# Patient Record
Sex: Female | Born: 1955
Health system: Southern US, Community
[De-identification: ages and names within clinical notes are randomized; demographics above are authoritative.]

## PROBLEM LIST (undated history)

## (undated) DIAGNOSIS — I10 Essential (primary) hypertension: Secondary | ICD-10-CM

## (undated) DIAGNOSIS — E119 Type 2 diabetes mellitus without complications: Secondary | ICD-10-CM

## (undated) DIAGNOSIS — E785 Hyperlipidemia, unspecified: Secondary | ICD-10-CM

## (undated) HISTORY — PX: ABDOMINAL HYSTERECTOMY: SHX81

---

## 2004-12-05 ENCOUNTER — Ambulatory Visit: Payer: Self-pay

## 2005-09-18 ENCOUNTER — Ambulatory Visit: Payer: Self-pay | Admitting: *Deleted

## 2006-03-27 ENCOUNTER — Ambulatory Visit: Payer: Self-pay | Admitting: Obstetrics and Gynecology

## 2008-02-03 ENCOUNTER — Ambulatory Visit: Payer: Self-pay

## 2008-04-14 ENCOUNTER — Ambulatory Visit: Payer: Self-pay | Admitting: Gastroenterology

## 2010-05-02 ENCOUNTER — Ambulatory Visit: Payer: Self-pay | Admitting: Family Medicine

## 2011-08-29 ENCOUNTER — Ambulatory Visit: Payer: Self-pay | Admitting: Obstetrics and Gynecology

## 2013-05-14 ENCOUNTER — Ambulatory Visit: Payer: Self-pay | Admitting: Family Medicine

## 2014-03-11 DIAGNOSIS — I10 Essential (primary) hypertension: Secondary | ICD-10-CM | POA: Insufficient documentation

## 2014-03-11 DIAGNOSIS — E119 Type 2 diabetes mellitus without complications: Secondary | ICD-10-CM | POA: Insufficient documentation

## 2014-03-11 DIAGNOSIS — E785 Hyperlipidemia, unspecified: Secondary | ICD-10-CM | POA: Insufficient documentation

## 2014-05-18 ENCOUNTER — Ambulatory Visit: Payer: Self-pay

## 2015-02-11 ENCOUNTER — Other Ambulatory Visit: Payer: Self-pay | Admitting: Family Medicine

## 2015-02-11 DIAGNOSIS — Z1231 Encounter for screening mammogram for malignant neoplasm of breast: Secondary | ICD-10-CM

## 2016-04-26 ENCOUNTER — Telehealth: Payer: Self-pay | Admitting: Gastroenterology

## 2016-04-26 ENCOUNTER — Other Ambulatory Visit: Payer: Self-pay | Admitting: Nurse Practitioner

## 2016-04-26 DIAGNOSIS — Z1239 Encounter for other screening for malignant neoplasm of breast: Secondary | ICD-10-CM

## 2016-04-26 NOTE — Telephone Encounter (Signed)
colonoscopy

## 2016-05-08 NOTE — Telephone Encounter (Signed)
Called patient, left voicemail. Appointment not scheduled.

## 2016-05-11 NOTE — Telephone Encounter (Signed)
Sent patient a notification letter to their address on file  

## 2016-05-14 NOTE — Telephone Encounter (Signed)
Called pt and lvm. Notification letter sent on 05/11/2016

## 2016-07-17 ENCOUNTER — Ambulatory Visit
Admission: RE | Admit: 2016-07-17 | Discharge: 2016-07-17 | Disposition: A | Discharge status: Home / Self Care | Payer: 59 | Source: Ambulatory Visit | Attending: Nurse Practitioner | Admitting: Nurse Practitioner

## 2016-07-17 ENCOUNTER — Encounter: Payer: Self-pay | Admitting: Radiology

## 2016-07-17 DIAGNOSIS — Z1239 Encounter for other screening for malignant neoplasm of breast: Secondary | ICD-10-CM

## 2016-07-17 DIAGNOSIS — Z1231 Encounter for screening mammogram for malignant neoplasm of breast: Secondary | ICD-10-CM | POA: Diagnosis not present

## 2016-12-19 DIAGNOSIS — Z1159 Encounter for screening for other viral diseases: Secondary | ICD-10-CM | POA: Diagnosis not present

## 2016-12-19 DIAGNOSIS — E1121 Type 2 diabetes mellitus with diabetic nephropathy: Secondary | ICD-10-CM | POA: Diagnosis not present

## 2016-12-19 DIAGNOSIS — Z1389 Encounter for screening for other disorder: Secondary | ICD-10-CM | POA: Diagnosis not present

## 2016-12-19 DIAGNOSIS — Z1329 Encounter for screening for other suspected endocrine disorder: Secondary | ICD-10-CM | POA: Diagnosis not present

## 2016-12-19 DIAGNOSIS — E785 Hyperlipidemia, unspecified: Secondary | ICD-10-CM | POA: Diagnosis not present

## 2016-12-19 DIAGNOSIS — I10 Essential (primary) hypertension: Secondary | ICD-10-CM | POA: Diagnosis not present

## 2017-01-01 DIAGNOSIS — M25552 Pain in left hip: Secondary | ICD-10-CM | POA: Diagnosis not present

## 2017-01-01 DIAGNOSIS — M25551 Pain in right hip: Secondary | ICD-10-CM | POA: Diagnosis not present

## 2017-01-01 DIAGNOSIS — M545 Low back pain: Secondary | ICD-10-CM | POA: Diagnosis not present

## 2017-01-16 DIAGNOSIS — M545 Low back pain: Secondary | ICD-10-CM | POA: Diagnosis not present

## 2017-01-16 DIAGNOSIS — M5440 Lumbago with sciatica, unspecified side: Secondary | ICD-10-CM | POA: Diagnosis not present

## 2017-01-16 DIAGNOSIS — G8929 Other chronic pain: Secondary | ICD-10-CM | POA: Diagnosis not present

## 2017-01-16 DIAGNOSIS — M48061 Spinal stenosis, lumbar region without neurogenic claudication: Secondary | ICD-10-CM | POA: Diagnosis not present

## 2017-02-06 DIAGNOSIS — M5136 Other intervertebral disc degeneration, lumbar region: Secondary | ICD-10-CM | POA: Diagnosis not present

## 2017-02-06 DIAGNOSIS — M5440 Lumbago with sciatica, unspecified side: Secondary | ICD-10-CM | POA: Diagnosis not present

## 2017-02-06 DIAGNOSIS — M545 Low back pain: Secondary | ICD-10-CM | POA: Diagnosis not present

## 2017-02-13 DIAGNOSIS — M5417 Radiculopathy, lumbosacral region: Secondary | ICD-10-CM | POA: Diagnosis not present

## 2017-02-13 DIAGNOSIS — M545 Low back pain: Secondary | ICD-10-CM | POA: Diagnosis not present

## 2017-02-13 DIAGNOSIS — M48061 Spinal stenosis, lumbar region without neurogenic claudication: Secondary | ICD-10-CM | POA: Diagnosis not present

## 2017-02-22 DIAGNOSIS — M545 Low back pain: Secondary | ICD-10-CM | POA: Diagnosis not present

## 2017-02-22 DIAGNOSIS — M5417 Radiculopathy, lumbosacral region: Secondary | ICD-10-CM | POA: Diagnosis not present

## 2017-02-22 DIAGNOSIS — M48061 Spinal stenosis, lumbar region without neurogenic claudication: Secondary | ICD-10-CM | POA: Diagnosis not present

## 2017-02-25 DIAGNOSIS — M5417 Radiculopathy, lumbosacral region: Secondary | ICD-10-CM | POA: Diagnosis not present

## 2017-02-25 DIAGNOSIS — M48061 Spinal stenosis, lumbar region without neurogenic claudication: Secondary | ICD-10-CM | POA: Diagnosis not present

## 2017-02-25 DIAGNOSIS — M545 Low back pain: Secondary | ICD-10-CM | POA: Diagnosis not present

## 2017-02-28 DIAGNOSIS — M48061 Spinal stenosis, lumbar region without neurogenic claudication: Secondary | ICD-10-CM | POA: Diagnosis not present

## 2017-02-28 DIAGNOSIS — M545 Low back pain: Secondary | ICD-10-CM | POA: Diagnosis not present

## 2017-02-28 DIAGNOSIS — M5417 Radiculopathy, lumbosacral region: Secondary | ICD-10-CM | POA: Diagnosis not present

## 2017-03-05 DIAGNOSIS — M545 Low back pain: Secondary | ICD-10-CM | POA: Diagnosis not present

## 2017-03-05 DIAGNOSIS — M48061 Spinal stenosis, lumbar region without neurogenic claudication: Secondary | ICD-10-CM | POA: Diagnosis not present

## 2017-03-05 DIAGNOSIS — M5417 Radiculopathy, lumbosacral region: Secondary | ICD-10-CM | POA: Diagnosis not present

## 2017-03-06 DIAGNOSIS — E118 Type 2 diabetes mellitus with unspecified complications: Secondary | ICD-10-CM | POA: Diagnosis not present

## 2017-03-06 DIAGNOSIS — M545 Low back pain: Secondary | ICD-10-CM | POA: Diagnosis not present

## 2017-03-08 DIAGNOSIS — M545 Low back pain: Secondary | ICD-10-CM | POA: Diagnosis not present

## 2017-03-08 DIAGNOSIS — M48061 Spinal stenosis, lumbar region without neurogenic claudication: Secondary | ICD-10-CM | POA: Diagnosis not present

## 2017-03-08 DIAGNOSIS — M5417 Radiculopathy, lumbosacral region: Secondary | ICD-10-CM | POA: Diagnosis not present

## 2017-03-12 DIAGNOSIS — M48061 Spinal stenosis, lumbar region without neurogenic claudication: Secondary | ICD-10-CM | POA: Diagnosis not present

## 2017-03-12 DIAGNOSIS — M791 Myalgia: Secondary | ICD-10-CM | POA: Diagnosis not present

## 2017-03-12 DIAGNOSIS — M545 Low back pain: Secondary | ICD-10-CM | POA: Diagnosis not present

## 2017-03-12 DIAGNOSIS — M5417 Radiculopathy, lumbosacral region: Secondary | ICD-10-CM | POA: Diagnosis not present

## 2017-03-18 DIAGNOSIS — M5417 Radiculopathy, lumbosacral region: Secondary | ICD-10-CM | POA: Diagnosis not present

## 2017-03-18 DIAGNOSIS — M545 Low back pain: Secondary | ICD-10-CM | POA: Diagnosis not present

## 2017-03-18 DIAGNOSIS — M48061 Spinal stenosis, lumbar region without neurogenic claudication: Secondary | ICD-10-CM | POA: Diagnosis not present

## 2017-03-25 DIAGNOSIS — M5417 Radiculopathy, lumbosacral region: Secondary | ICD-10-CM | POA: Diagnosis not present

## 2017-03-25 DIAGNOSIS — M48061 Spinal stenosis, lumbar region without neurogenic claudication: Secondary | ICD-10-CM | POA: Diagnosis not present

## 2017-03-25 DIAGNOSIS — M545 Low back pain: Secondary | ICD-10-CM | POA: Diagnosis not present

## 2017-04-12 DIAGNOSIS — M791 Myalgia: Secondary | ICD-10-CM | POA: Diagnosis not present

## 2017-05-12 DIAGNOSIS — M791 Myalgia: Secondary | ICD-10-CM | POA: Diagnosis not present

## 2017-06-12 DIAGNOSIS — M791 Myalgia: Secondary | ICD-10-CM | POA: Diagnosis not present

## 2017-07-13 DIAGNOSIS — M791 Myalgia: Secondary | ICD-10-CM | POA: Diagnosis not present

## 2017-08-12 DIAGNOSIS — M791 Myalgia, unspecified site: Secondary | ICD-10-CM | POA: Diagnosis not present

## 2017-08-29 ENCOUNTER — Other Ambulatory Visit: Payer: Self-pay | Admitting: Family Medicine

## 2017-08-29 DIAGNOSIS — Z1231 Encounter for screening mammogram for malignant neoplasm of breast: Secondary | ICD-10-CM

## 2017-09-06 DIAGNOSIS — E785 Hyperlipidemia, unspecified: Secondary | ICD-10-CM | POA: Diagnosis not present

## 2017-09-06 DIAGNOSIS — E1121 Type 2 diabetes mellitus with diabetic nephropathy: Secondary | ICD-10-CM | POA: Diagnosis not present

## 2017-09-06 DIAGNOSIS — I10 Essential (primary) hypertension: Secondary | ICD-10-CM | POA: Diagnosis not present

## 2017-09-12 DIAGNOSIS — M791 Myalgia, unspecified site: Secondary | ICD-10-CM | POA: Diagnosis not present

## 2017-09-24 ENCOUNTER — Ambulatory Visit
Admission: RE | Admit: 2017-09-24 | Discharge: 2017-09-24 | Disposition: A | Discharge status: Home / Self Care | Payer: 59 | Source: Ambulatory Visit | Attending: Family Medicine | Admitting: Family Medicine

## 2017-09-24 DIAGNOSIS — Z1231 Encounter for screening mammogram for malignant neoplasm of breast: Secondary | ICD-10-CM | POA: Diagnosis present

## 2017-10-12 DIAGNOSIS — M791 Myalgia, unspecified site: Secondary | ICD-10-CM | POA: Diagnosis not present

## 2017-11-12 DIAGNOSIS — M791 Myalgia, unspecified site: Secondary | ICD-10-CM | POA: Diagnosis not present

## 2017-12-13 DIAGNOSIS — M791 Myalgia, unspecified site: Secondary | ICD-10-CM | POA: Diagnosis not present

## 2017-12-29 ENCOUNTER — Encounter: Payer: Self-pay | Admitting: Emergency Medicine

## 2017-12-29 ENCOUNTER — Emergency Department
Admission: EM | Admit: 2017-12-29 | Discharge: 2017-12-29 | Disposition: A | Discharge status: Home / Self Care | Payer: Commercial Managed Care - HMO | Attending: Emergency Medicine | Admitting: Emergency Medicine

## 2017-12-29 DIAGNOSIS — I10 Essential (primary) hypertension: Secondary | ICD-10-CM | POA: Insufficient documentation

## 2017-12-29 DIAGNOSIS — S0083XA Contusion of other part of head, initial encounter: Secondary | ICD-10-CM | POA: Insufficient documentation

## 2017-12-29 DIAGNOSIS — Z23 Encounter for immunization: Secondary | ICD-10-CM | POA: Diagnosis not present

## 2017-12-29 DIAGNOSIS — Y92511 Restaurant or cafe as the place of occurrence of the external cause: Secondary | ICD-10-CM | POA: Diagnosis not present

## 2017-12-29 DIAGNOSIS — S0081XA Abrasion of other part of head, initial encounter: Secondary | ICD-10-CM | POA: Insufficient documentation

## 2017-12-29 DIAGNOSIS — W19XXXA Unspecified fall, initial encounter: Secondary | ICD-10-CM

## 2017-12-29 DIAGNOSIS — Y998 Other external cause status: Secondary | ICD-10-CM | POA: Insufficient documentation

## 2017-12-29 DIAGNOSIS — W01198A Fall on same level from slipping, tripping and stumbling with subsequent striking against other object, initial encounter: Secondary | ICD-10-CM | POA: Diagnosis not present

## 2017-12-29 DIAGNOSIS — Y9389 Activity, other specified: Secondary | ICD-10-CM | POA: Insufficient documentation

## 2017-12-29 DIAGNOSIS — E119 Type 2 diabetes mellitus without complications: Secondary | ICD-10-CM | POA: Diagnosis not present

## 2017-12-29 DIAGNOSIS — S0993XA Unspecified injury of face, initial encounter: Secondary | ICD-10-CM | POA: Diagnosis present

## 2017-12-29 DIAGNOSIS — S161XXA Strain of muscle, fascia and tendon at neck level, initial encounter: Secondary | ICD-10-CM | POA: Insufficient documentation

## 2017-12-29 HISTORY — DX: Type 2 diabetes mellitus without complications: E11.9

## 2017-12-29 HISTORY — DX: Essential (primary) hypertension: I10

## 2017-12-29 HISTORY — DX: Hyperlipidemia, unspecified: E78.5

## 2017-12-29 MED ORDER — SULFAMETHOXAZOLE-TRIMETHOPRIM 800-160 MG PO TABS: 1.0000 | ORAL_TABLET | Freq: Two times a day (BID) | ORAL | 0 refills | Status: DC

## 2017-12-29 MED ORDER — METHOCARBAMOL 500 MG PO TABS: 500.0000 mg | ORAL_TABLET | Freq: Four times a day (QID) | ORAL | 0 refills | Status: AC

## 2017-12-29 MED ORDER — MELOXICAM 15 MG PO TABS: 15.0000 mg | ORAL_TABLET | Freq: Every day | ORAL | 0 refills | Status: AC

## 2017-12-29 MED ORDER — TETANUS-DIPHTH-ACELL PERTUSSIS 5-2.5-18.5 LF-MCG/0.5 IM SUSP
0.5000 mL | Freq: Once | INTRAMUSCULAR | Status: AC
  Administered 2017-12-29: 0.5 mL via INTRAMUSCULAR
  Filled 2017-12-29: qty 0.5

## 2017-12-29 NOTE — ED Notes (Signed)
Pt states fell last night after tripping over a curb. Pt presents with abrasions to face. C/o neck pain. Pt denies LOC at this time. Pt is alert and oriented. NAD noted.

## 2017-12-29 NOTE — ED Provider Notes (Signed)
[facility] Emergency Department Provider Note  ____________________________________________  Time seen: Approximately 2:59 PM  I have reviewed the triage vital signs and the nursing notes.   HISTORY  Chief Complaint Fall    HPI Maria Estrada is a 62 y.o. female who presents the emergency department status post mechanical fall.  Patient reports that she tripped over a curb, fell striking her face on the ground.  Patient reports that she did not lose consciousness prior to or status post a fall.  Patient reports that she has sustained abrasions to the face, nose as well as a "puffy lip."  Patient denies any headache, visual changes, facial pain at this time.  Patient reports mild right-sided neck pain/stiffness.  She has full range of motion to the neck and states that on extreme rotation to the right side she has tightness along the paraspinal muscle region.  Patient states that she had tried to catch herself with her hands but denies any kind of abrasion, lacerations, musculoskeletal complaints to bilateral hands or wrists.  Patient is not taking medications prior to arrival.  She is unsure of her last tetanus shot.  No other complaints at this time.  Past Medical History:  Diagnosis Date  . Diabetes mellitus without complication (HCC)   . Hyperlipidemia   . Hypertension     There are no active problems to display for this patient.   Past Surgical History:  Procedure Laterality Date  . ABDOMINAL HYSTERECTOMY      Prior to Admission medications   Medication Sig Start Date End Date Taking? Authorizing Provider  meloxicam (MOBIC) 15 MG tablet Take 1 tablet (15 mg total) by mouth daily. 12/29/17   Rosanne Wohlfarth, Delorise RoyalsJonathan D, PA-C  methocarbamol (ROBAXIN) 500 MG tablet Take 1 tablet (500 mg total) by mouth 4 (four) times daily. 12/29/17   Gregorio Worley, Delorise RoyalsJonathan D, PA-C  sulfamethoxazole-trimethoprim (BACTRIM DS,SEPTRA DS) 800-160 MG tablet Take 1 tablet by mouth 2  (two) times daily. 12/29/17   Jahniya Duzan, Delorise RoyalsJonathan D, PA-C    Allergies Penicillins  Family History  Problem Relation Age of Onset  . Breast cancer Maternal Aunt     Social History Social History   Tobacco Use  . Smoking status: Never Smoker  . Smokeless tobacco: Never Used  Substance Use Topics  . Alcohol use: Yes  . Drug use: No     Review of Systems  Constitutional: No fever/chills Eyes: No visual changes. No discharge ENT: No upper respiratory complaints. Cardiovascular: no chest pain. Respiratory: no cough. No SOB. Gastrointestinal: No abdominal pain.  No nausea, no vomiting.  No diarrhea.  No constipation. Musculoskeletal: Positive for right-sided neck pain. Skin: Positive for abrasion to the forehead, nose. Neurological: Negative for headaches, focal weakness or numbness. 10-point ROS otherwise negative.  ____________________________________________   PHYSICAL EXAM:  VITAL SIGNS: ED Triage Vitals  Enc Vitals Group     BP 12/29/17 1402 (!) 144/69     Pulse Rate 12/29/17 1402 68     Resp 12/29/17 1402 18     Temp 12/29/17 1402 98.4 F (36.9 C)     Temp Source 12/29/17 1402 Oral     SpO2 12/29/17 1402 97 %     Weight 12/29/17 1403 283 lb (128.4 kg)     Height 12/29/17 1403 5\' 10"  (1.778 m)     Head Circumference --      Peak Flow --      Pain Score 12/29/17 1403 1     Pain Loc --  Pain Edu? --      Excl. in GC? --      Constitutional: Alert and oriented. Well appearing and in no acute distress. Eyes: Conjunctivae are normal. PERRL. EOMI. Head: Patient has superficial abrasion noted to the forehead, nose.  No foreign body.  No bleeding.  No lacerations noted.  Patient is nontender palpation of the osseous structures of the skull and face.  No battle signs.  No raccoon eyes.  No serosanguineous fluid drainage from the ears or nares.  ENT:      Ears:       Nose: No congestion/rhinnorhea.      Mouth/Throat: Mucous membranes are moist.  Neck: No  stridor.  No cervical spine tenderness to palpation.  Patient is nontender both midline and paraspinal muscle regions.  She reports tenderness with extreme rotation to the right.  No other reported pain complaints with range of motion.  Full range of motion to the cervical spine.  Radial pulse intact bilateral upper extremities.  Sensation intact and equal bilateral upper extremities.  Cardiovascular: Normal rate, regular rhythm. Normal S1 and S2.  Good peripheral circulation. Respiratory: Normal respiratory effort without tachypnea or retractions. Lungs CTAB. Good air entry to the bases with no decreased or absent breath sounds. Musculoskeletal: Full range of motion to all extremities. No gross deformities appreciated. Neurologic:  Normal speech and language. No gross focal neurologic deficits are appreciated.  Cranial nerves II through XII grossly intact.  Negative Romberg's and pronator drift. Skin:  Skin is warm, dry and intact. No rash noted. Psychiatric: Mood and affect are normal. Speech and behavior are normal. Patient exhibits appropriate insight and judgement.   ____________________________________________   LABS (all labs ordered are listed, but only abnormal results are displayed)  Labs Reviewed - No data to display ____________________________________________  EKG   ____________________________________________  RADIOLOGY   No results found.  ____________________________________________    PROCEDURES  Procedure(s) performed:    Procedures    Canadian CT Head Rule   CT head is recommended if yes to ANY of the following:   Major Criteria ("high risk" for an injury requiring neurosurgical intervention, sensitivity 100%):   No.   GCS < 15 at 2 hours post-injury No.   Suspected open or depressed skull fracture No.   Any sign of basilar skull fracture? (Hemotympanum, racoon eyes, battle's sign, CSF oto/rhinorrhea) No.   ? 2 episodes of vomiting No.   Age ? 65    Minor Criteria ("medium" risk for an intracranial traumatic finding, sensitivity 83-100%):   No.   Retrograde Amnesia to the Event ? 30 minutes No.   "Dangerous" Mechanism? (Pedestrian struck by motor vehicle, occupant ejected from motor vehicle, fall from >3 ft or >5 stairs.)   Based on my evaluation of the patient, including application of this decision instrument, CT head to evaluate for traumatic intracranial injury is not indicated at this time. I have discussed this recommendation with the patient who states understanding and agreement with this plan.  NEXUS C-spine Criteria   C-spine imaging is recommended if yes to ANY of the following ([place]neum[place] is "NSAID"):   No.  N - neurologic (focal) deficit present No.   S - spinal midline tenderness present No.  A - altered level of consciousness present No.    I  - intoxication present No.   D - distracting injury present   Based on my evaluation of the patient, including application of this decision instrument, cervical spine imaging to evaluate for  injury is not indicated at this time. I have discussed this recommendation with the patient who states understanding and agreement with this plan.   Medications - No data to display   ____________________________________________   INITIAL IMPRESSION / ASSESSMENT AND PLAN / ED COURSE  Pertinent labs & imaging results that were available during my care of the patient were reviewed by me and considered in my medical decision making (see chart for details).  Review of the [place] CSRS was performed in accordance of the [facility] prior to dispensing any controlled drugs.     Patient's diagnosis is consistent with fall resulting in facial abrasions and cervical strain.  Patient presents the emergency department after mechanical fall.  No loss of consciousness.  Exam is reassuring with no deficits.  Differential included mechanical fall, loss of consciousness, skull fracture, facial fracture,  intracranial hemorrhage, contusion of the face, abrasion of the face.  At this time, patient was negative on Canadian head CT rules and Nexus criteria for imaging.  I discussed at length with the patient the pros and cons of imaging versus no imaging.  Patient declines imaging at this time.  Wound care provided to patient.  Tetanus shot is updated at this time.. Patient will be discharged home with prescriptions for antibiotics prophylactically for multiple abrasions to the face.  Meloxicam and Robaxin for symptom control of cervical strain.  Patient is to follow up with primary care as needed or otherwise directed. Patient is given ED precautions to return to the ED for any worsening or new symptoms.     ____________________________________________  FINAL CLINICAL IMPRESSION(S) / ED DIAGNOSES  Final diagnoses:  Fall, initial encounter  Contusion of face, initial encounter  Abrasion of face, initial encounter  Strain of neck muscle, initial encounter      NEW MEDICATIONS STARTED DURING THIS VISIT:  ED Discharge Orders        Ordered    sulfamethoxazole-trimethoprim (BACTRIM DS,SEPTRA DS) 800-160 MG tablet  2 times daily     12/29/17 1624    meloxicam (MOBIC) 15 MG tablet  Daily     12/29/17 1624    methocarbamol (ROBAXIN) 500 MG tablet  4 times daily     12/29/17 1624          This chart was dictated using voice recognition software/Dragon. Despite best efforts to proofread, errors can occur which can change the meaning. Any change was purely unintentional.    Racheal Patches, PA-C 12/29/17 1624    Jene Every, MD 12/29/17 [phone]

## 2017-12-29 NOTE — ED Notes (Addendum)
FIRST NURSE NOTE: [facility] pt states she fell face first after tripping on a curb in front of [employer]. Pt has abrasions to face and lip swelling. Pt is alert and oriented. Denies any SOB. Pt states she is not on any blood thinners.

## 2017-12-29 NOTE — ED Triage Notes (Signed)
Pt comes into the ED via POV c/o a fall where she tripped over the curb.  Patient fell on her face and has abrasions present on the center of her nose and forehead.  Denies LOC, nausea, or dizziness.  Patient has even and unlabored respirations and is in NAD at this time.

## 2018-01-10 DIAGNOSIS — M791 Myalgia, unspecified site: Secondary | ICD-10-CM | POA: Diagnosis not present

## 2018-02-10 DIAGNOSIS — M791 Myalgia, unspecified site: Secondary | ICD-10-CM | POA: Diagnosis not present

## 2018-03-12 DIAGNOSIS — M791 Myalgia, unspecified site: Secondary | ICD-10-CM | POA: Diagnosis not present

## 2018-04-08 DIAGNOSIS — E785 Hyperlipidemia, unspecified: Secondary | ICD-10-CM | POA: Diagnosis not present

## 2018-04-08 DIAGNOSIS — Z1389 Encounter for screening for other disorder: Secondary | ICD-10-CM | POA: Diagnosis not present

## 2018-04-08 DIAGNOSIS — E1121 Type 2 diabetes mellitus with diabetic nephropathy: Secondary | ICD-10-CM | POA: Diagnosis not present

## 2018-04-08 DIAGNOSIS — I10 Essential (primary) hypertension: Secondary | ICD-10-CM | POA: Diagnosis not present

## 2018-04-12 DIAGNOSIS — M791 Myalgia, unspecified site: Secondary | ICD-10-CM | POA: Diagnosis not present

## 2018-05-12 DIAGNOSIS — M791 Myalgia, unspecified site: Secondary | ICD-10-CM | POA: Diagnosis not present

## 2018-06-12 DIAGNOSIS — M791 Myalgia, unspecified site: Secondary | ICD-10-CM | POA: Diagnosis not present

## 2018-07-13 DIAGNOSIS — M791 Myalgia, unspecified site: Secondary | ICD-10-CM | POA: Diagnosis not present

## 2018-08-12 DIAGNOSIS — M791 Myalgia, unspecified site: Secondary | ICD-10-CM | POA: Diagnosis not present

## 2018-09-12 DIAGNOSIS — M791 Myalgia, unspecified site: Secondary | ICD-10-CM | POA: Diagnosis not present

## 2018-10-12 DIAGNOSIS — M791 Myalgia, unspecified site: Secondary | ICD-10-CM | POA: Diagnosis not present

## 2018-10-28 ENCOUNTER — Other Ambulatory Visit: Payer: Self-pay | Admitting: Family Medicine

## 2018-10-28 DIAGNOSIS — Z1231 Encounter for screening mammogram for malignant neoplasm of breast: Secondary | ICD-10-CM

## 2018-11-12 DIAGNOSIS — M791 Myalgia, unspecified site: Secondary | ICD-10-CM | POA: Diagnosis not present

## 2018-11-17 ENCOUNTER — Ambulatory Visit
Admission: RE | Admit: 2018-11-17 | Discharge: 2018-11-17 | Disposition: A | Discharge status: Home / Self Care | Payer: 59 | Source: Ambulatory Visit | Attending: Family Medicine | Admitting: Family Medicine

## 2018-11-17 DIAGNOSIS — Z1231 Encounter for screening mammogram for malignant neoplasm of breast: Secondary | ICD-10-CM | POA: Diagnosis not present

## 2018-12-13 DIAGNOSIS — M791 Myalgia, unspecified site: Secondary | ICD-10-CM | POA: Diagnosis not present

## 2018-12-31 DIAGNOSIS — I10 Essential (primary) hypertension: Secondary | ICD-10-CM | POA: Diagnosis not present

## 2018-12-31 DIAGNOSIS — E785 Hyperlipidemia, unspecified: Secondary | ICD-10-CM | POA: Diagnosis not present

## 2018-12-31 DIAGNOSIS — E1121 Type 2 diabetes mellitus with diabetic nephropathy: Secondary | ICD-10-CM | POA: Diagnosis not present

## 2019-01-11 DIAGNOSIS — M791 Myalgia, unspecified site: Secondary | ICD-10-CM | POA: Diagnosis not present

## 2019-02-11 DIAGNOSIS — M791 Myalgia, unspecified site: Secondary | ICD-10-CM | POA: Diagnosis not present

## 2019-02-20 IMAGING — MG DIGITAL SCREENING BILATERAL MAMMOGRAM WITH TOMO AND CAD
8 of 14 series · 8 of 40 positions shown · non-contrast
Comparison: Previous exam(s).

CLINICAL DATA: Screening.

EXAM:
DIGITAL SCREENING BILATERAL MAMMOGRAM WITH TOMO AND CAD

[R MLO synth-2D (1 of 2)]
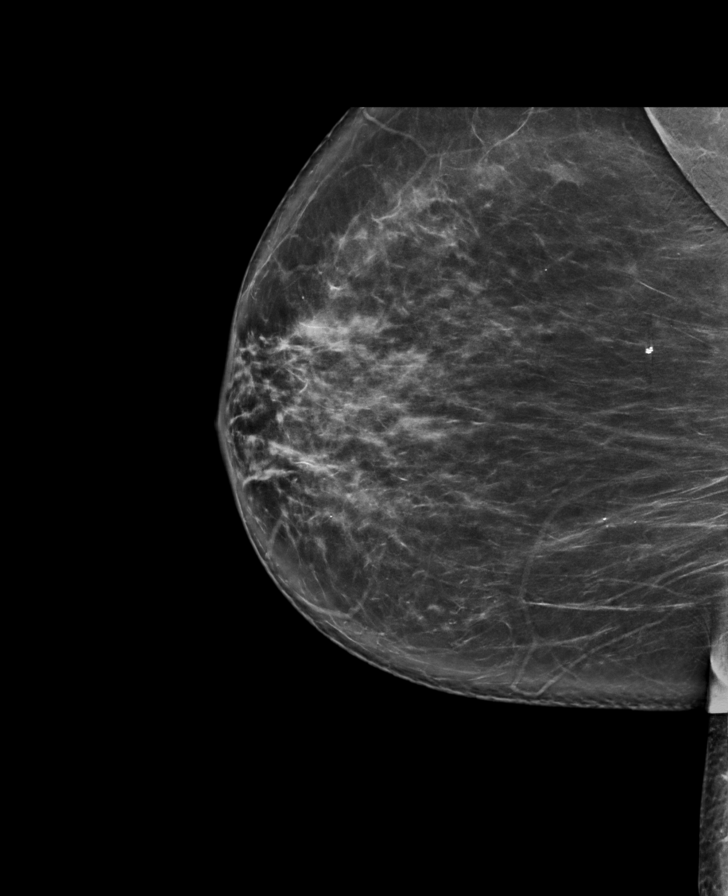

[L MLO synth-2D (1 of 2)]
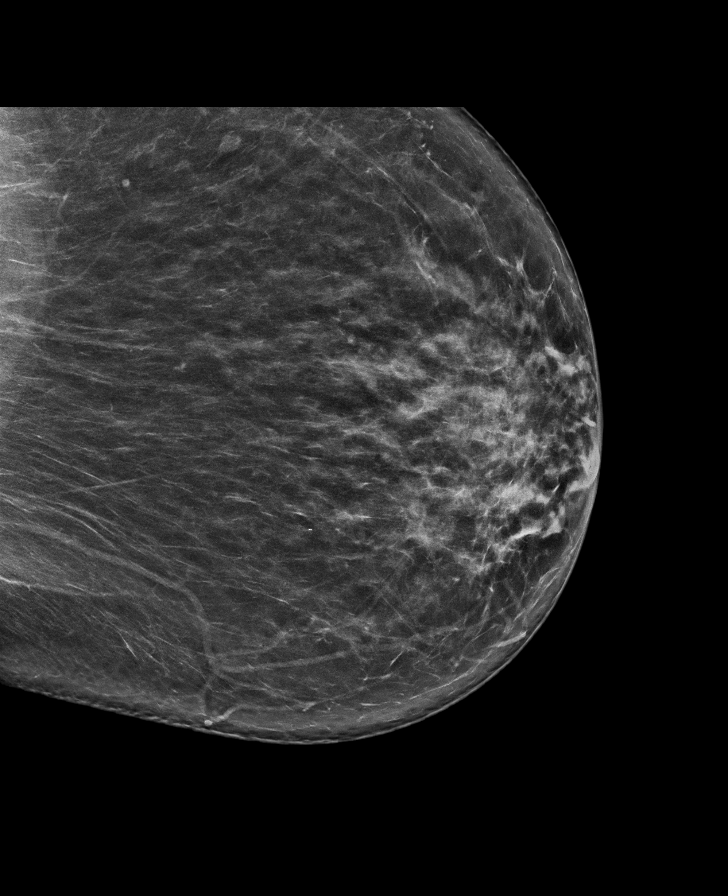

[L CV synth-2D]
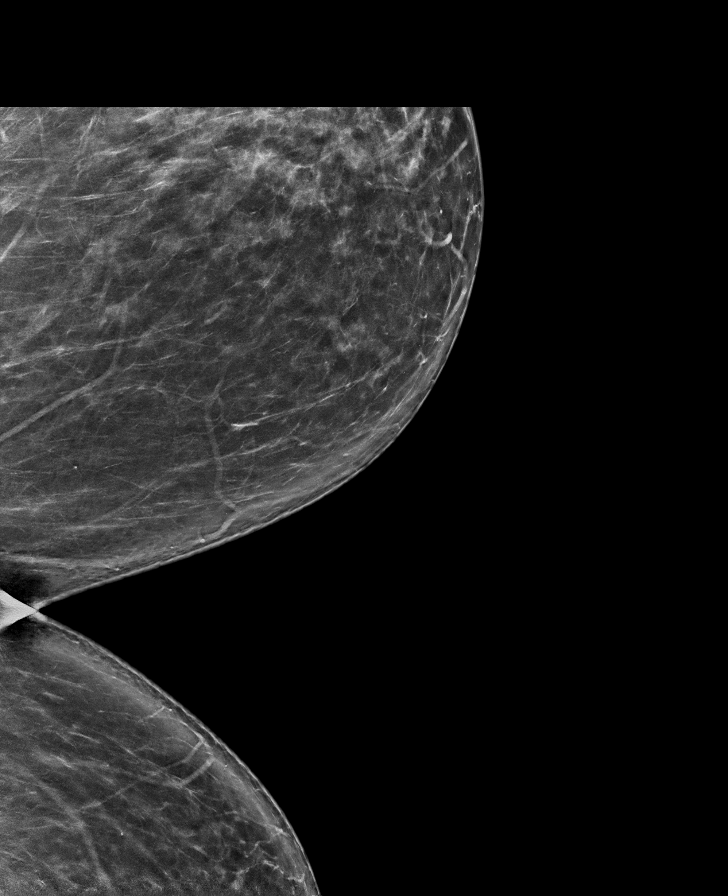

[R CC synth-2D]
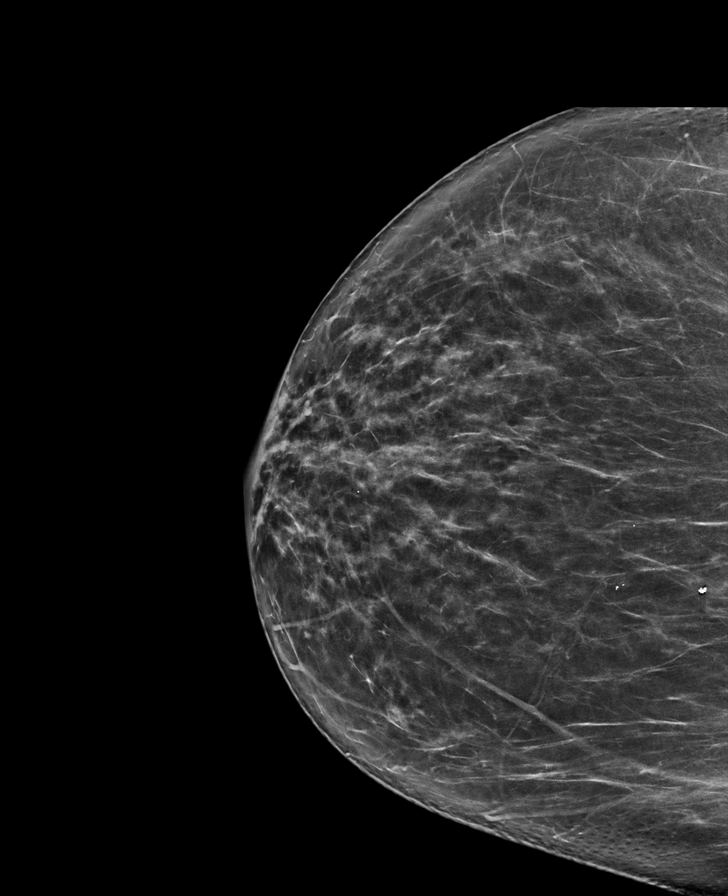

[L MLO synth-2D (2 of 2)]
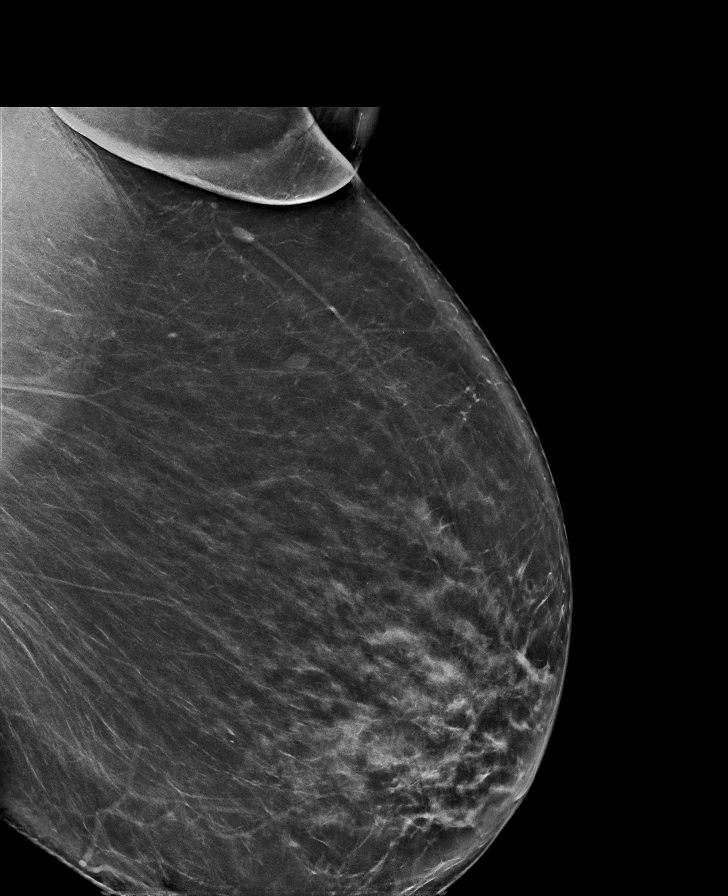

[L CC synth-2D]
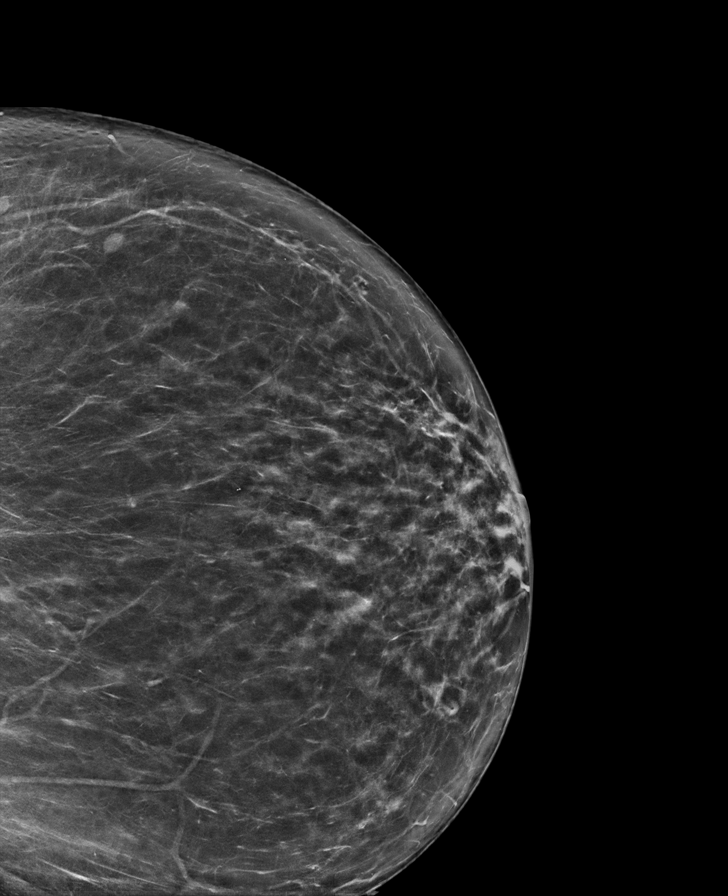

[R MLO synth-2D (2 of 2)]
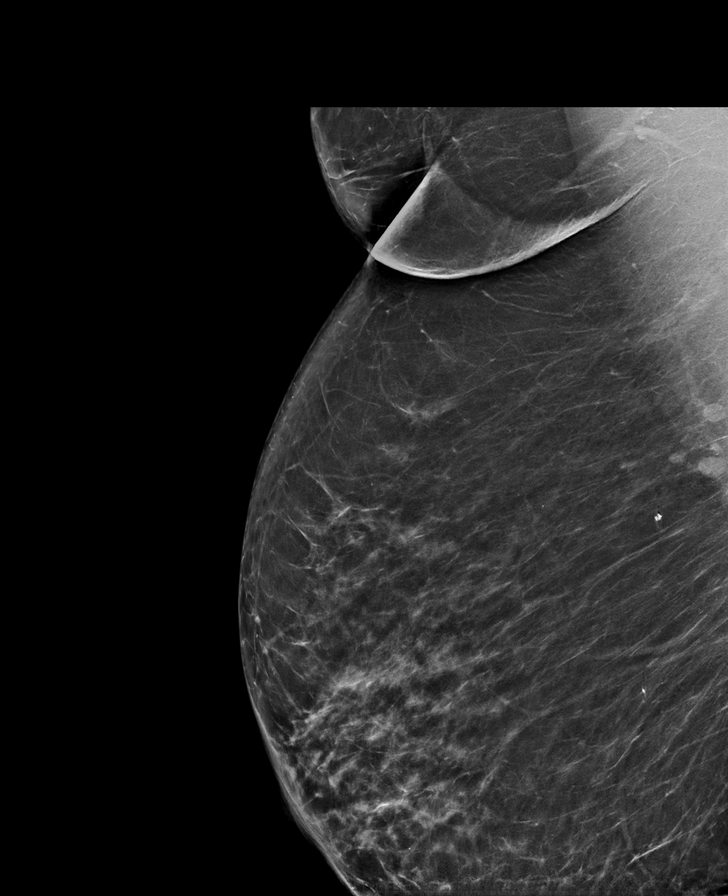

[R MLO tomo · tomo slice 47/93.0]
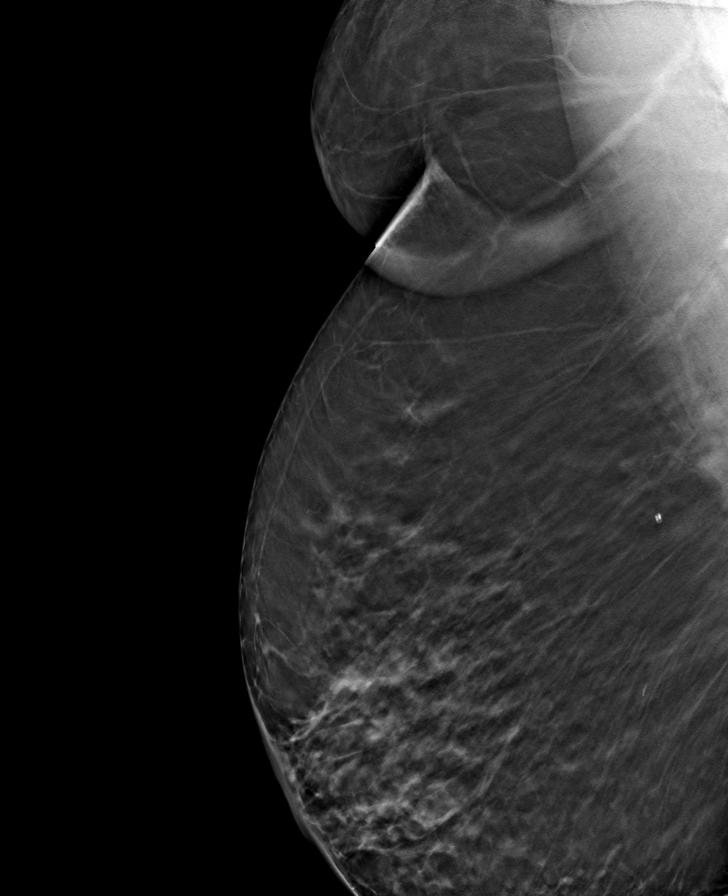

[8 of 40 positions shown; findings below may reference images not displayed]

ACR Breast Density Category b: There are scattered areas of
fibroglandular density.
FINDINGS: There are no findings suspicious for malignancy. Images were
processed with CAD.
IMPRESSION: No mammographic evidence of malignancy. A result letter of this
screening mammogram will be mailed directly to the patient.

RECOMMENDATION:
Screening mammogram in one year. (Code:[id number])

BI-RADS CATEGORY  1: Negative.

## 2019-03-13 DIAGNOSIS — M791 Myalgia, unspecified site: Secondary | ICD-10-CM | POA: Diagnosis not present

## 2019-12-22 ENCOUNTER — Other Ambulatory Visit: Payer: Self-pay | Admitting: Family Medicine

## 2019-12-22 DIAGNOSIS — Z1231 Encounter for screening mammogram for malignant neoplasm of breast: Secondary | ICD-10-CM

## 2020-01-12 ENCOUNTER — Ambulatory Visit
Admission: RE | Admit: 2020-01-12 | Discharge: 2020-01-12 | Disposition: A | Discharge status: Home / Self Care | Payer: 59 | Source: Ambulatory Visit | Attending: Family Medicine | Admitting: Family Medicine

## 2020-01-12 DIAGNOSIS — Z1231 Encounter for screening mammogram for malignant neoplasm of breast: Secondary | ICD-10-CM | POA: Diagnosis not present

## 2020-01-19 ENCOUNTER — Other Ambulatory Visit: Payer: Self-pay | Admitting: Family Medicine

## 2020-01-19 DIAGNOSIS — R928 Other abnormal and inconclusive findings on diagnostic imaging of breast: Secondary | ICD-10-CM

## 2020-01-19 DIAGNOSIS — N6320 Unspecified lump in the left breast, unspecified quadrant: Secondary | ICD-10-CM

## 2020-01-22 ENCOUNTER — Ambulatory Visit
Admission: RE | Admit: 2020-01-22 | Discharge: 2020-01-22 | Disposition: A | Discharge status: Home / Self Care | Payer: 59 | Source: Ambulatory Visit | Attending: Family Medicine | Admitting: Family Medicine

## 2020-01-22 DIAGNOSIS — R928 Other abnormal and inconclusive findings on diagnostic imaging of breast: Secondary | ICD-10-CM | POA: Insufficient documentation

## 2020-01-22 DIAGNOSIS — N6320 Unspecified lump in the left breast, unspecified quadrant: Secondary | ICD-10-CM | POA: Diagnosis present

## 2020-01-26 ENCOUNTER — Ambulatory Visit: Payer: 59

## 2020-01-26 ENCOUNTER — Other Ambulatory Visit: Payer: 59

## 2020-06-28 ENCOUNTER — Other Ambulatory Visit: Payer: Self-pay | Admitting: Family Medicine

## 2020-06-28 ENCOUNTER — Other Ambulatory Visit (HOSPITAL_COMMUNITY): Payer: Self-pay | Admitting: Family Medicine

## 2020-06-28 DIAGNOSIS — M79609 Pain in unspecified limb: Secondary | ICD-10-CM

## 2020-06-28 DIAGNOSIS — M25561 Pain in right knee: Secondary | ICD-10-CM

## 2020-06-29 ENCOUNTER — Ambulatory Visit
Admission: RE | Admit: 2020-06-29 | Discharge: 2020-06-29 | Disposition: A | Discharge status: Home / Self Care | Payer: BC Managed Care – PPO | Source: Ambulatory Visit | Attending: Family Medicine | Admitting: Family Medicine

## 2020-06-29 ENCOUNTER — Other Ambulatory Visit: Payer: Self-pay

## 2020-06-29 DIAGNOSIS — M25561 Pain in right knee: Secondary | ICD-10-CM

## 2020-06-29 DIAGNOSIS — M79609 Pain in unspecified limb: Secondary | ICD-10-CM | POA: Insufficient documentation

## 2021-01-16 ENCOUNTER — Other Ambulatory Visit: Payer: Self-pay | Admitting: Family Medicine

## 2021-01-16 DIAGNOSIS — Z1231 Encounter for screening mammogram for malignant neoplasm of breast: Secondary | ICD-10-CM

## 2021-01-17 ENCOUNTER — Encounter: Payer: Self-pay | Admitting: Gastroenterology

## 2021-01-17 ENCOUNTER — Encounter: Payer: Self-pay | Admitting: *Deleted

## 2021-01-25 ENCOUNTER — Telehealth: Payer: BC Managed Care – PPO

## 2021-01-26 ENCOUNTER — Telehealth (INDEPENDENT_AMBULATORY_CARE_PROVIDER_SITE_OTHER): Payer: Self-pay | Admitting: Gastroenterology

## 2021-01-26 ENCOUNTER — Other Ambulatory Visit: Payer: Self-pay

## 2021-01-26 DIAGNOSIS — Z1211 Encounter for screening for malignant neoplasm of colon: Secondary | ICD-10-CM

## 2021-01-26 MED ORDER — NA SULFATE-K SULFATE-MG SULF 17.5-3.13-1.6 GM/177ML PO SOLN: 1.0000 | Freq: Once | ORAL | 0 refills | Status: AC

## 2021-01-26 NOTE — Progress Notes (Signed)
Gastroenterology Pre-Procedure Review  Request Date: 02/16/21 Requesting Physician: Dr. Bonna Gains  PATIENT REVIEW QUESTIONS: The patient responded to the following health history questions as indicated:    1. Are you having any GI issues? no 2. Do you have a personal history of Polyps? no 3. Do you have a family history of Colon Cancer or Polyps? no 4. Diabetes Mellitus? yes (type 2) 5. Joint replacements in the past 12 months?no 6. Major health problems in the past 3 months?no 7. Any artificial heart valves, MVP, or defibrillator?no    MEDICATIONS & ALLERGIES:    Patient reports the following regarding taking any anticoagulation/antiplatelet therapy:   Plavix, Coumadin, Eliquis, Xarelto, Lovenox, Pradaxa, Brilinta, or Effient? no Aspirin? no  Patient confirms/reports the following medications:  Current Outpatient Medications  Medication Sig Dispense Refill  . amLODipine (NORVASC) 10 MG tablet Take by mouth.    Marland Kitchen atorvastatin (LIPITOR) 10 MG tablet Take by mouth.    . carvedilol (COREG) 12.5 MG tablet Take by mouth.    Marland Kitchen glipiZIDE (GLUCOTROL) 10 MG tablet     . hydrochlorothiazide (HYDRODIURIL) 25 MG tablet     . linagliptin (TRADJENTA) 5 MG TABS tablet Take by mouth.    . losartan (COZAAR) 100 MG tablet     . meloxicam (MOBIC) 15 MG tablet Take 1 tablet (15 mg total) by mouth daily. 30 tablet 0  . metFORMIN (GLUCOPHAGE) 850 MG tablet     . methocarbamol (ROBAXIN) 500 MG tablet Take 1 tablet (500 mg total) by mouth 4 (four) times daily. 16 tablet 0  . omeprazole (PRILOSEC) 20 MG capsule Take 20 mg by mouth daily.    . Na Sulfate-K Sulfate-Mg Sulf 17.5-3.13-1.6 GM/177ML SOLN Take 1 kit by mouth once for 1 dose. 354 mL 0   No current facility-administered medications for this visit.    Patient confirms/reports the following allergies:  Allergies  Allergen Reactions  . Penicillins     Orders Placed This Encounter  Procedures  . Procedural/ Surgical Case Request:  COLONOSCOPY WITH PROPOFOL    Standing Status:   Standing    Number of Occurrences:   1    Order Specific Question:   Pre-op diagnosis    Answer:   screening colonoscopy    Order Specific Question:   CPT Code    Answer:   13643    AUTHORIZATION INFORMATION Primary Insurance: 1D#: Group #:  Secondary Insurance: 1D#: Group #:  SCHEDULE INFORMATION: Date: Thursday 02/16/21 Time: Location:[facility]

## 2021-02-09 ENCOUNTER — Ambulatory Visit
Admission: RE | Admit: 2021-02-09 | Discharge: 2021-02-09 | Disposition: A | Discharge status: Home / Self Care | Payer: BC Managed Care – PPO | Source: Ambulatory Visit | Attending: Family Medicine | Admitting: Family Medicine

## 2021-02-09 ENCOUNTER — Other Ambulatory Visit: Payer: Self-pay

## 2021-02-09 DIAGNOSIS — Z1231 Encounter for screening mammogram for malignant neoplasm of breast: Secondary | ICD-10-CM | POA: Diagnosis present

## 2021-02-16 ENCOUNTER — Encounter: Payer: Self-pay | Admitting: Anesthesiology

## 2021-02-16 ENCOUNTER — Telehealth: Payer: Self-pay

## 2021-02-16 ENCOUNTER — Ambulatory Visit
Admission: RE | Admit: 2021-02-16 | Discharge: 2021-02-16 | Disposition: A | Discharge status: Home / Self Care | Payer: BC Managed Care – PPO | Attending: Gastroenterology | Admitting: Gastroenterology

## 2021-02-16 ENCOUNTER — Encounter
Admission: RE | Disposition: A | Discharge status: Home / Self Care | Payer: Self-pay | Source: Home / Self Care | Attending: Gastroenterology

## 2021-02-16 DIAGNOSIS — Z1211 Encounter for screening for malignant neoplasm of colon: Secondary | ICD-10-CM

## 2021-02-16 SURGERY — COLONOSCOPY WITH PROPOFOL
Anesthesia: General

## 2021-02-16 MED ORDER — PROPOFOL 500 MG/50ML IV EMUL
INTRAVENOUS | Status: AC
  Filled 2021-02-16: qty 300

## 2021-02-16 MED ORDER — SODIUM CHLORIDE 0.9 % IV SOLN: INTRAVENOUS | Status: DC

## 2021-02-16 NOTE — Telephone Encounter (Signed)
Per Dr. Maximino Greenland via secure chat: hey this pt ws cancelled this morning as she threw up her prep and output still had stool. can we call her and reschedule. I can do it as soon as tomorrow if she is available. we can do sutabs for her for next time. if not, I have room on mon, tues, wed next week as well    I then called atient and she did not answer. I left her a voicemail to call me back to reschedule her colonoscopy if she wanted to by trying another prep.

## 2021-02-17 NOTE — Telephone Encounter (Signed)
Called patient again today and she didn't answer. I left her a voicemail to call us back so we could reschedule her procedure. Awaiting patient's call.

## 2021-02-23 NOTE — Telephone Encounter (Signed)
Called patient and se did not answer again. I left her a voicemail to call back.

## 2021-03-14 ENCOUNTER — Telehealth: Payer: Self-pay | Admitting: Gastroenterology

## 2021-03-14 NOTE — Telephone Encounter (Signed)
LVM and is ready to schedule her colonoscopy.

## 2021-03-28 ENCOUNTER — Other Ambulatory Visit: Payer: Self-pay

## 2021-03-28 DIAGNOSIS — Z1211 Encounter for screening for malignant neoplasm of colon: Secondary | ICD-10-CM

## 2021-03-28 MED ORDER — SUTAB 1479-225-188 MG PO TABS: ORAL_TABLET | ORAL | 0 refills | Status: AC

## 2021-03-28 NOTE — Addendum Note (Signed)
Addended by: Roena Malady on: 03/28/2021 10:37 AM   Modules accepted: Orders

## 2021-03-28 NOTE — Telephone Encounter (Signed)
colonoscopy scheduled for June 23

## 2021-04-18 ENCOUNTER — Telehealth: Payer: Self-pay | Admitting: Gastroenterology

## 2021-04-18 NOTE — Telephone Encounter (Signed)
Please cancel procedure for now per pt.

## 2021-04-20 ENCOUNTER — Ambulatory Visit
Admission: RE | Admit: 2021-04-20 | Payer: BC Managed Care – PPO | Source: Home / Self Care | Admitting: Gastroenterology

## 2021-04-20 ENCOUNTER — Encounter: Admission: RE | Payer: Self-pay | Source: Home / Self Care

## 2021-04-20 SURGERY — COLONOSCOPY WITH PROPOFOL
Anesthesia: General

## 2021-07-17 ENCOUNTER — Telehealth: Payer: Self-pay

## 2021-07-17 NOTE — Telephone Encounter (Signed)
Pt. Calling to schedule a colonoscopy 

## 2021-07-18 NOTE — Telephone Encounter (Signed)
Returned patients call. Unable to leave message. 

## 2021-08-15 ENCOUNTER — Telehealth: Payer: Self-pay

## 2021-08-15 NOTE — Telephone Encounter (Signed)
Pt. Calling to reschedule colonoscopy 

## 2021-08-15 NOTE — Telephone Encounter (Signed)
Returned patients call. No answer.

## 2022-03-14 ENCOUNTER — Other Ambulatory Visit: Payer: Self-pay | Admitting: Family Medicine

## 2022-03-14 DIAGNOSIS — Z1231 Encounter for screening mammogram for malignant neoplasm of breast: Secondary | ICD-10-CM

## 2022-03-27 ENCOUNTER — Telehealth: Payer: Self-pay

## 2022-03-27 NOTE — Telephone Encounter (Signed)
Called no answer no voicemail sent out reminder letter

## 2022-04-17 ENCOUNTER — Ambulatory Visit
Admission: RE | Admit: 2022-04-17 | Discharge: 2022-04-17 | Disposition: A | Discharge status: Home / Self Care | Payer: BC Managed Care – PPO | Source: Ambulatory Visit | Attending: Family Medicine | Admitting: Family Medicine

## 2022-04-17 DIAGNOSIS — Z1231 Encounter for screening mammogram for malignant neoplasm of breast: Secondary | ICD-10-CM | POA: Diagnosis present

## 2023-05-08 ENCOUNTER — Other Ambulatory Visit: Payer: Self-pay | Admitting: Family Medicine

## 2023-05-08 DIAGNOSIS — Z1231 Encounter for screening mammogram for malignant neoplasm of breast: Secondary | ICD-10-CM

## 2023-05-24 ENCOUNTER — Ambulatory Visit: Admission: RE | Admit: 2023-05-24 | Payer: Medicare Other | Source: Ambulatory Visit

## 2023-05-24 DIAGNOSIS — Z1231 Encounter for screening mammogram for malignant neoplasm of breast: Secondary | ICD-10-CM | POA: Diagnosis present

## 2024-06-23 ENCOUNTER — Other Ambulatory Visit: Payer: Self-pay | Admitting: Family Medicine

## 2024-06-23 DIAGNOSIS — Z1231 Encounter for screening mammogram for malignant neoplasm of breast: Secondary | ICD-10-CM

## 2024-07-15 ENCOUNTER — Ambulatory Visit
Admission: RE | Admit: 2024-07-15 | Discharge: 2024-07-15 | Disposition: A | Discharge status: Home / Self Care | Source: Ambulatory Visit | Attending: Family Medicine | Admitting: Family Medicine

## 2024-07-15 DIAGNOSIS — Z1231 Encounter for screening mammogram for malignant neoplasm of breast: Secondary | ICD-10-CM | POA: Insufficient documentation
